# Patient Record
Sex: Male | Born: 1965 | Race: Black or African American | Hispanic: No | Marital: Married | Smoking: Never smoker
Health system: Southern US, Community
[De-identification: ages and names within clinical notes are randomized; demographics above are authoritative.]

## PROBLEM LIST (undated history)

## (undated) DIAGNOSIS — E291 Testicular hypofunction: Secondary | ICD-10-CM

## (undated) DIAGNOSIS — I10 Essential (primary) hypertension: Secondary | ICD-10-CM

## (undated) HISTORY — DX: Testicular hypofunction: E29.1

## (undated) HISTORY — DX: Essential (primary) hypertension: I10

---

## 2004-10-19 ENCOUNTER — Other Ambulatory Visit: Payer: Self-pay

## 2004-10-19 ENCOUNTER — Emergency Department: Payer: Self-pay | Admitting: Emergency Medicine

## 2006-03-08 IMAGING — NM NM MYOCARD GATED
1 series · 2 of 2 positions shown · non-contrast
Comparison: none

REASON FOR EXAM: Chest pain
COMMENTS:

[Series 1: save_screens · 2 of 2 slices shown]
[im 1/2]
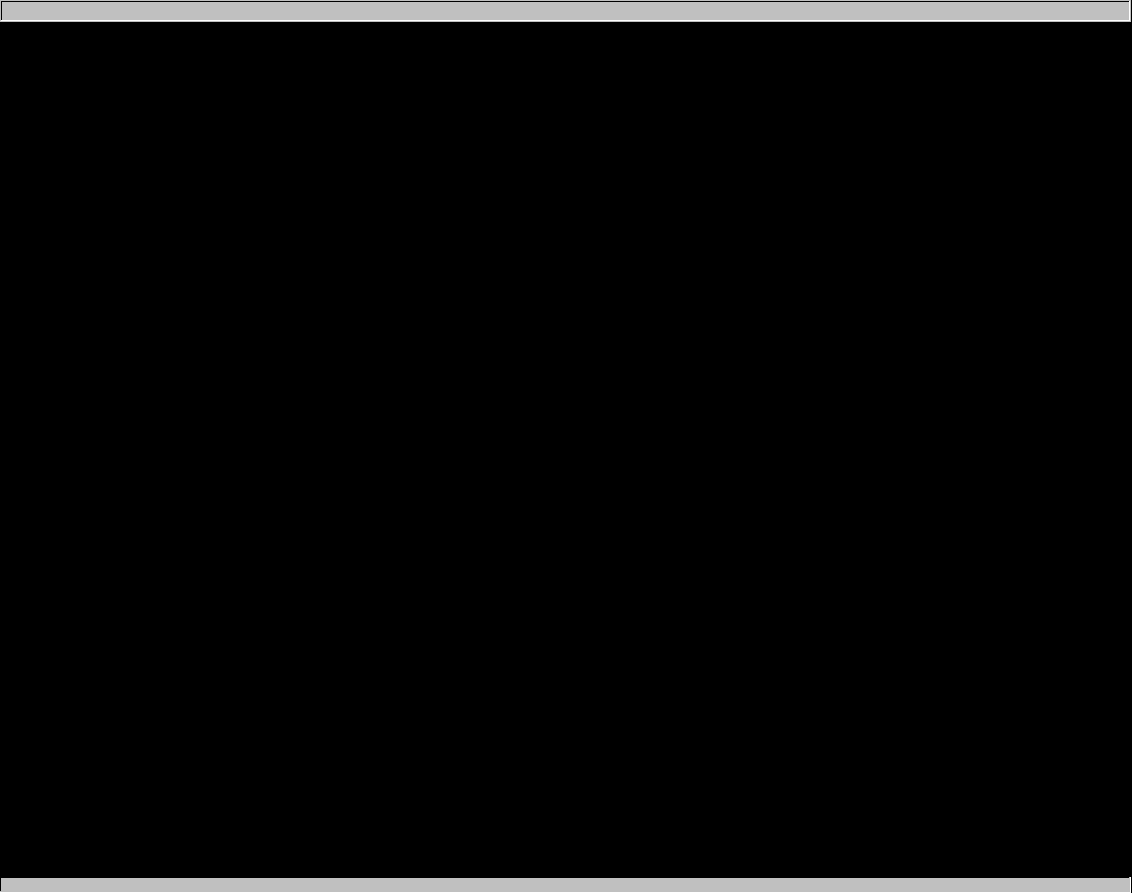
[im 2/2]
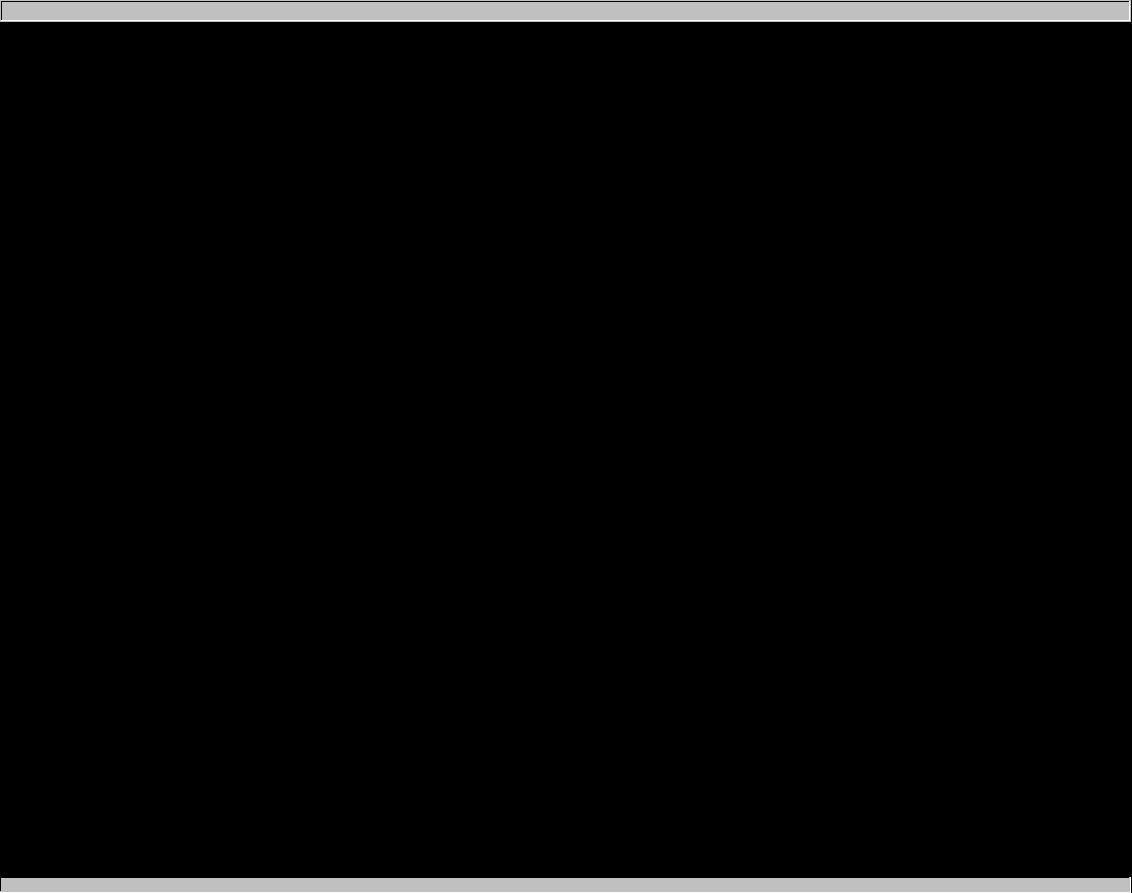

[2 of 2 positions shown; findings below may reference images not displayed]

PROCEDURE:     NM  - NM  GATED MYOVIEW   [DATE]  [DATE]

RESULT:     Stress portion was completed under the direction and read by
Hernan Foust.

The patient received 13.27 mCi of Myoview at stress and 30.01 mCi of Myoview
for delayed imaging. LEFT ventricular function is determined with ejection
fraction of 54%. Rest and exercise SPECT imaging in vertical long axis,
horizontal long axis and short axis views in comparison to computer
generated tomographic imaging revealed mild inferior hypoperfusion at stress
and rest with no significant reversibility.
IMPRESSION: 1.     LV function near normal at 54%.
2.     Fixed inferior defect with no reversibility noted on this study.

## 2011-10-12 DIAGNOSIS — N529 Male erectile dysfunction, unspecified: Secondary | ICD-10-CM

## 2011-10-12 DIAGNOSIS — E291 Testicular hypofunction: Secondary | ICD-10-CM | POA: Insufficient documentation

## 2011-10-12 DIAGNOSIS — N400 Enlarged prostate without lower urinary tract symptoms: Secondary | ICD-10-CM | POA: Insufficient documentation

## 2011-10-12 HISTORY — DX: Male erectile dysfunction, unspecified: N52.9

## 2013-12-24 ENCOUNTER — Encounter: Payer: Self-pay | Admitting: Podiatry

## 2013-12-24 ENCOUNTER — Ambulatory Visit (INDEPENDENT_AMBULATORY_CARE_PROVIDER_SITE_OTHER): Payer: BC Managed Care – PPO

## 2013-12-24 ENCOUNTER — Ambulatory Visit (INDEPENDENT_AMBULATORY_CARE_PROVIDER_SITE_OTHER): Payer: BC Managed Care – PPO | Admitting: Podiatry

## 2013-12-24 VITALS — BP 119/71 | HR 62 | Resp 16 | Ht 75.0 in | Wt 250.0 lb

## 2013-12-24 DIAGNOSIS — M898X7 Other specified disorders of bone, ankle and foot: Secondary | ICD-10-CM

## 2013-12-24 DIAGNOSIS — M779 Enthesopathy, unspecified: Secondary | ICD-10-CM

## 2013-12-24 DIAGNOSIS — M2570 Osteophyte, unspecified joint: Secondary | ICD-10-CM

## 2013-12-24 DIAGNOSIS — M7661 Achilles tendinitis, right leg: Secondary | ICD-10-CM

## 2013-12-24 DIAGNOSIS — M722 Plantar fascial fibromatosis: Secondary | ICD-10-CM

## 2013-12-24 MED ORDER — MELOXICAM 7.5 MG PO TABS: 7.5000 mg | ORAL_TABLET | Freq: Every day | ORAL | Status: DC

## 2013-12-24 NOTE — Patient Instructions (Signed)
Achilles Tendinitis   with Rehab  Achilles tendinitis is a disorder of the Achilles tendon. The Achilles tendon connects the large calf muscles (Gastrocnemius and Soleus) to the heel bone (calcaneus). This tendon is sometimes called the heel cord. It is important for pushing-off and standing on your toes and is important for walking, running, or jumping. Tendinitis is often caused by overuse and repetitive microtrauma.  SYMPTOMS  · Pain, tenderness, swelling, warmth, and redness may occur over the Achilles tendon even at rest.  · Pain with pushing off, or flexing or extending the ankle.  · Pain that is worsened after or during activity.  CAUSES   · Overuse sometimes seen with rapid increase in exercise programs or in sports requiring running and jumping.  · Poor physical conditioning (strength and flexibility or endurance).  · Running sports, especially training running down hills.  · Inadequate warm-up before practice or play or failure to stretch before participation.  · Injury to the tendon.  PREVENTION   · Warm up and stretch before practice or competition.  · Allow time for adequate rest and recovery between practices and competition.  · Keep up conditioning.  ¨ Keep up ankle and leg flexibility.  ¨ Improve or keep muscle strength and endurance.  ¨ Improve cardiovascular fitness.  · Use proper technique.  · Use proper equipment (shoes, skates).  · To help prevent recurrence, taping, protective strapping, or an adhesive bandage may be recommended for several weeks after healing is complete.  PROGNOSIS   · Recovery may take weeks to several months to heal.  · Longer recovery is expected if symptoms have been prolonged.  · Recovery is usually quicker if the inflammation is due to a direct blow as compared with overuse or sudden strain.  RELATED COMPLICATIONS   · Healing time will be prolonged if the condition is not correctly treated. The injury must be given plenty of time to heal.  · Symptoms can reoccur if  activity is resumed too soon.  · Untreated, tendinitis may increase the risk of tendon rupture requiring additional time for recovery and possibly surgery.  TREATMENT   · The first treatment consists of rest anti-inflammatory medication, and ice to relieve the pain.  · Stretching and strengthening exercises after resolution of pain will likely help reduce the risk of recurrence. Referral to a physical therapist or athletic trainer for further evaluation and treatment may be helpful.  · A walking boot or cast may be recommended to rest the Achilles tendon. This can help break the cycle of inflammation and microtrauma.  · Arch supports (orthotics) may be prescribed or recommended by your caregiver as an adjunct to therapy and rest.  · Surgery to remove the inflamed tendon lining or degenerated tendon tissue is rarely necessary and has shown less than predictable results.  MEDICATION   · Nonsteroidal anti-inflammatory medications, such as aspirin and ibuprofen, may be used for pain and inflammation relief. Do not take within 7 days before surgery. Take these as directed by your caregiver. Contact your caregiver immediately if any bleeding, stomach upset, or signs of allergic reaction occur. Other minor pain relievers, such as acetaminophen, may also be used.  · Pain relievers may be prescribed as necessary by your caregiver. Do not take prescription pain medication for longer than 4 to 7 days. Use only as directed and only as much as you need.  · Cortisone injections are rarely indicated. Cortisone injections may weaken tendons and predispose to rupture. It is better   to give the condition more time to heal than to use them.  HEAT AND COLD  · Cold is used to relieve pain and reduce inflammation for acute and chronic Achilles tendinitis. Cold should be applied for 10 to 15 minutes every 2 to 3 hours for inflammation and pain and immediately after any activity that aggravates your symptoms. Use ice packs or an ice  massage.  · Heat may be used before performing stretching and strengthening activities prescribed by your caregiver. Use a heat pack or a warm soak.  SEEK MEDICAL CARE IF:  · Symptoms get worse or do not improve in 2 weeks despite treatment.  · New, unexplained symptoms develop. Drugs used in treatment may produce side effects.  EXERCISES  RANGE OF MOTION (ROM) AND STRETCHING EXERCISES - Achilles Tendinitis   These exercises may help you when beginning to rehabilitate your injury. Your symptoms may resolve with or without further involvement from your physician, physical therapist or athletic trainer. While completing these exercises, remember:   · Restoring tissue flexibility helps normal motion to return to the joints. This allows healthier, less painful movement and activity.  · An effective stretch should be held for at least 30 seconds.  · A stretch should never be painful. You should only feel a gentle lengthening or release in the stretched tissue.  STRETCH - Gastroc, Standing   · Place hands on wall.  · Extend right / left leg, keeping the front knee somewhat bent.  · Slightly point your toes inward on your back foot.  · Keeping your right / left heel on the floor and your knee straight, shift your weight toward the wall, not allowing your back to arch.  · You should feel a gentle stretch in the right / left calf. Hold this position for __________ seconds.  Repeat __________ times. Complete this stretch __________ times per day.  STRETCH - Soleus, Standing   · Place hands on wall.  · Extend right / left leg, keeping the other knee somewhat bent.  · Slightly point your toes inward on your back foot.  · Keep your right / left heel on the floor, bend your back knee, and slightly shift your weight over the back leg so that you feel a gentle stretch deep in your back calf.  · Hold this position for __________ seconds.  Repeat __________ times. Complete this stretch __________ times per day.  STRETCH -  Gastrocsoleus, Standing   Note: This exercise can place a lot of stress on your foot and ankle. Please complete this exercise only if specifically instructed by your caregiver.   · Place the ball of your right / left foot on a step, keeping your other foot firmly on the same step.  · Hold on to the wall or a rail for balance.  · Slowly lift your other foot, allowing your body weight to press your heel down over the edge of the step.  · You should feel a stretch in your right / left calf.  · Hold this position for __________ seconds.  · Repeat this exercise with a slight bend in your knee.  Repeat __________ times. Complete this stretch __________ times per day.   STRENGTHENING EXERCISES - Achilles Tendinitis  These exercises may help you when beginning to rehabilitate your injury. They may resolve your symptoms with or without further involvement from your physician, physical therapist or athletic trainer. While completing these exercises, remember:   · Muscles can gain both the endurance   and the strength needed for everyday activities through controlled exercises.  · Complete these exercises as instructed by your physician, physical therapist or athletic trainer. Progress the resistance and repetitions only as guided.  · You may experience muscle soreness or fatigue, but the pain or discomfort you are trying to eliminate should never worsen during these exercises. If this pain does worsen, stop and make certain you are following the directions exactly. If the pain is still present after adjustments, discontinue the exercise until you can discuss the trouble with your clinician.  STRENGTH - Plantar-flexors   · Sit with your right / left leg extended. Holding onto both ends of a rubber exercise band/tubing, loop it around the ball of your foot. Keep a slight tension in the band.  · Slowly push your toes away from you, pointing them downward.  · Hold this position for __________ seconds. Return slowly, controlling the  tension in the band/tubing.  Repeat __________ times. Complete this exercise __________ times per day.   STRENGTH - Plantar-flexors   · Stand with your feet shoulder width apart. Steady yourself with a wall or table using as little support as needed.  · Keeping your weight evenly spread over the width of your feet, rise up on your toes.*  · Hold this position for __________ seconds.  Repeat __________ times. Complete this exercise __________ times per day.   *If this is too easy, shift your weight toward your right / left leg until you feel challenged. Ultimately, you may be asked to do this exercise with your right / left foot only.  STRENGTH - Plantar-flexors, Eccentric   Note: This exercise can place a lot of stress on your foot and ankle. Please complete this exercise only if specifically instructed by your caregiver.   · Place the balls of your feet on a step. With your hands, use only enough support from a wall or rail to keep your balance.  · Keep your knees straight and rise up on your toes.  · Slowly shift your weight entirely to your right / left toes and pick up your opposite foot. Gently and with controlled movement, lower your weight through your right / left foot so that your heel drops below the level of the step. You will feel a slight stretch in the back of your calf at the end position.  · Use the healthy leg to help rise up onto the balls of both feet, then lower weight only on the right / left leg again. Build up to 15 repetitions. Then progress to 3 consecutive sets of 15 repetitions.*  · After completing the above exercise, complete the same exercise with a slight knee bend (about 30 degrees). Again, build up to 15 repetitions. Then progress to 3 consecutive sets of 15 repetitions.*  Perform this exercise __________ times per day.   *When you easily complete 3 sets of 15, your physician, physical therapist or athletic trainer may advise you to add resistance by wearing a backpack filled with  additional weight.  STRENGTH - Plantar Flexors, Seated   · Sit on a chair that allows your feet to rest flat on the ground. If necessary, sit at the edge of the chair.  · Keeping your toes firmly on the ground, lift your right / left heel as far as you can without increasing any discomfort in your ankle.  Repeat __________ times. Complete this exercise __________ times a day.  *If instructed by your physician, physical therapist or athletic   trainer, you may add ____________________ of resistance by placing a weighted object on your right / left knee.  Document Released: 08/04/2004 Document Revised: 03/28/2011 Document Reviewed: 04/17/2008  [employer]® Patient Information ©2015 [employer]. This information is not intended to replace advice given to you by your health care provider. Make sure you discuss any questions you have with your health care provider.

## 2013-12-24 NOTE — Progress Notes (Signed)
   Subjective:    Patient ID: Glenn LippsJames E Boakye, male    DOB: 01/31/1965, 48 y.o.   MRN: [id number]  HPI Comments: 48 year old male presents the office today with complaints of pain in the back of his right heel. He states that he has had a bone spur for approximately 3 years and he has intermittent pain to the area. He states that at times he can barely walk due to pain. He states he has pain in the morning or after periods of rest. Over the last couple weeks she has noticed increased pain to the foot. Denies any sick injury or trauma to the area. No other complaints at this time.   Foot Pain      Review of Systems  HENT: Positive for sinus pressure.   Genitourinary: Positive for urgency.  Musculoskeletal:       Joint pain  Allergic/Immunologic: Positive for environmental allergies.  All other systems reviewed and are negative.      Objective:   Physical Exam AAO x3, NAD DP/PT pulses palpable bilaterally, CRT less than 3 seconds Protective sensation intact with Simms Weinstein monofilament, vibratory sensation intact, Achilles tendon reflex intact Tennis palpation overlying the posterior aspect of the right calcaneus at the insertion of the Achilles tendon. There is a prominent retrocalcaneal exostosis identified. There is slight overlying edema to the area. There is no pain on the mid seconds Achilles tendon and a Thompson test was performed and was negative. There is no overlying erythema or increase in warmth. There is no pain on the plantar aspect of the calcaneus on the insertion of the plantar fascia. There is no pain with lateral compression of the calcaneus or pain with vibratory sensation. There is a small retrocalcaneal exostosis palpable in the left lower extremity however not as symptomatic as the right. No pain along the course of the left Achilles tendon. MMT 5/5, ROM WNL No open lesions or pre-ulcerative lesions No pain with calf compression, swelling, warmth, erythema.          Assessment & Plan:  48 year old male with retrocalcaneal exostosis and insertional Achilles tendinitis. -X-rays were obtained and reviewed with the patient. -Conservative versus surgical treatment were discussed including alternatives, risks, complications. -At this time the patient's considering surgical intervention however like to wait until after Christmas. Discussed. Conservative treatments with the patient. At this time prescribed Mobic (patient's blood pressure is controlled). Side effects the medication were discussed the patient. Should any occur and call the office.  -Dispensed heel lifts  -Discussed stretching exercises. Discussed with him to slowly start his exercises. Should any increase in symptoms arise to decreased activity.  -Ice to the area.  -Follow-up in 4 weeks or sooner if any problems are to arise. In the meantime, call the office in the questions, concerns, change in symptoms. Follow-up with PCP for other issues mentioned and review of systems.

## 2013-12-25 ENCOUNTER — Encounter: Payer: Self-pay | Admitting: Podiatry

## 2014-01-21 ENCOUNTER — Ambulatory Visit: Payer: BC Managed Care – PPO | Admitting: Podiatry

## 2014-01-30 ENCOUNTER — Ambulatory Visit: Payer: Self-pay | Admitting: Podiatry

## 2014-02-08 ENCOUNTER — Ambulatory Visit: Payer: Self-pay | Admitting: Podiatry

## 2014-02-19 ENCOUNTER — Ambulatory Visit: Payer: Self-pay | Admitting: Podiatry

## 2014-02-20 ENCOUNTER — Ambulatory Visit: Payer: Self-pay | Admitting: Podiatry

## 2014-04-10 ENCOUNTER — Ambulatory Visit (INDEPENDENT_AMBULATORY_CARE_PROVIDER_SITE_OTHER): Payer: BLUE CROSS/BLUE SHIELD | Admitting: Podiatry

## 2014-04-10 VITALS — BP 123/84 | HR 66 | Resp 16

## 2014-04-10 DIAGNOSIS — M2570 Osteophyte, unspecified joint: Secondary | ICD-10-CM | POA: Diagnosis not present

## 2014-04-10 DIAGNOSIS — M7661 Achilles tendinitis, right leg: Secondary | ICD-10-CM

## 2014-04-10 DIAGNOSIS — M898X7 Other specified disorders of bone, ankle and foot: Secondary | ICD-10-CM

## 2014-04-10 NOTE — Patient Instructions (Signed)
Pre-Operative Instructions  Congratulations, you have decided to take an important step to improving your quality of life.  You can be assured that the doctors of [facility] will be with you every step of the way.  1. Plan to be at the surgery center/hospital at least 1 (one) hour prior to your scheduled time unless otherwise directed by the surgical center/hospital staff.  You must have a responsible adult accompany you, remain during the surgery and drive you home.  Make sure you have directions to the surgical center/hospital and know how to get there on time. 2. For hospital based surgery you will need to obtain a history and physical form from your family physician within 1 month prior to the date of surgery- we will give you a form for you primary physician.  3. We make every effort to accommodate the date you request for surgery.  There are however, times where surgery dates or times have to be moved.  We will contact you as soon as possible if a change in schedule is required.   4. No Aspirin/Ibuprofen for one week before surgery.  If you are on aspirin, any non-steroidal anti-inflammatory medications (Mobic, Aleve, Ibuprofen) you should stop taking it 7 days prior to your surgery.  You make take Tylenol  For pain prior to surgery.  5. Medications- If you are taking daily heart and blood pressure medications, seizure, reflux, allergy, asthma, anxiety, pain or diabetes medications, make sure the surgery center/hospital is aware before the day of surgery so they may notify you which medications to take or avoid the day of surgery. 6. No food or drink after midnight the night before surgery unless directed otherwise by surgical center/hospital staff. 7. No alcoholic beverages 24 hours prior to surgery.  No smoking 24 hours prior to or 24 hours after surgery. 8. Wear loose pants or shorts- loose enough to fit over bandages, boots, and casts. 9. No slip on shoes, sneakers are best. 10. Bring  your boot with you to the surgery center/hospital.  Also bring crutches or a walker if your physician has prescribed it for you.  If you do not have this equipment, it will be provided for you after surgery. 11. If you have not been contracted by the surgery center/hospital by the day before your surgery, call to confirm the date and time of your surgery. 12. Leave-time from work may vary depending on the type of surgery you have.  Appropriate arrangements should be made prior to surgery with your employer. 13. Prescriptions will be provided immediately following surgery by your doctor.  Have these filled as soon as possible after surgery and take the medication as directed. 14. Remove nail polish on the operative foot. 15. Wash the night before surgery.  The night before surgery wash the foot and leg well with the antibacterial soap provided and water paying special attention to beneath the toenails and in between the toes.  Rinse thoroughly with water and dry well with a towel.  Perform this wash unless told not to do so by your physician.  Enclosed: 1 Ice pack (please put in freezer the night before surgery)   1 Hibiclens skin cleaner   Pre-op Instructions  If you have any questions regarding the instructions, do not hesitate to call our office.  [facility]: [address] [place], [place] [address] [phone]  [facility]: [address] [place], [place] [address] [phone]  [facility]: [address]  [place], [place] [address] [phone]  Dr. Richard   Tuchman DPM, Dr. Norman Regal DPM Dr. Richard Sikora DPM, Dr. M. Todd Hyatt DPM, Dr. Kathryn Egerton DPM, Dr. Matthew Wagoner DPM 

## 2014-04-13 ENCOUNTER — Encounter: Payer: Self-pay | Admitting: Podiatry

## 2014-04-13 NOTE — Progress Notes (Addendum)
Patient ID: DISHON KEHOE, male   DOB: 08/24/1965, 49 y.o.   MRN: [id number]  Subjective: Mr. Morken presents the office today with continued pain in the back of his right heel. He states that since last appointment he has had increased pain overlying the back of his heel particular shoe gear and pressure. He states that he is started to walk with a limp due to the discomfort. He's been continuing stretching activities, anti-inflammatories, shoe gear modifications, heel lifts without any resolution. He states they started have pain around his ankle as he believes he is walking differently. He denies any recent injury or trauma. This time he is requesting surgical intervention. No other complaints at this time and no acute changes since last appointment.   Objective: AAO x3, NAD DP/PT pulses palpable b/l, CRT < 3 sec Protective sensation intact with Simms Weinstein monofilament, vibratory sensation intact, Achilles tendon reflex intact. There is significant in is along the posterior aspect of the right calcaneus at the insertion of the Achilles tendon area there is a prominent retrocalcaneal exostosis palpable. There is no pain on the midsubstance of the Achilles tendon there is no defect noted. Thompson test was performed and the Achilles tendon is intact. There is no pain with lateral compression of the calcaneus or pain with vibratory sensation. There is no pain on the plantar aspect of the calcaneus or along the course of/insertion of the plantar fascia. There is mild discomfort along the peroneal tendon posterior to lateral malleolus. There is no area pinpoint bony tenderness or pain with vibratory sensation. There is mild edema overlying the insertion of the Achilles tendon onto the calcaneus without any associated erythema or increase in warmth. No other areas of edema, erythema, increase in warmth to bilateral lower extremities. There is also prominent retrocalcaneal exostosis and the left side however it  is not symptomatic. MMT 5/5, ROM WNL No open lesions or pre-ulcer lesions identified bilaterally No pain with calf compression, swelling, warmth, erythema.  Assessment: 49 year old male with insertional Achilles tendinitis, retrocalcaneal exostosis with increased pain  Plan: -Discussed the patient both conservative and surgical options. At this time the patient states he is a tentative conservative treatment and he has pain on a more continual basis on a daily basis. At this time he is requesting surgical intervention. I discussed with him removal of the retrocalcaneal exostosis with repair of Achilles tendon and use of bone anchors. I discussed with the patient the incision placement as well as the postoperative course. Risks of the surgery were discussed the patient which include, not limited to, infection, bleeding, swelling, pain, need for further surgery, delayed or nonhealing, painful or ugly scar, numbness or sensation changes, reoccurrence, over/under correction, tendon rupture, DVT/PE, less of foot/leg, hardware failure. He understands these risks and wishes to proceed with surgery. All 3 pages the surgical consent was reviewed with the patient. No promises or guarantees of the outcome of the surgery were given. All questions were answered to the best of my ability. -Surgery will be performed at the [facility] center. -Patient is requesting a steroid injection around the symptomatic area to the right posterior calcaneus. I discussed the patient that steroid injections to this area can result in a high risk of tendon ruptures. The patient understands this and states that he has had some which pain to the area that he would like an injection. After discussing risks and complications patient verbally consented. Under sterile conditions a total of 1 mL mixture of dexamethasone  phosphate and 0.5% Marcaine plain was infiltrated into the posterior lateral aspect of the calcaneus. Care  was taken not to inject directly into the Achilles tendon. A Band-Aid was applied. Postinjection care was discussed the patient. Dispensed CAM walker. Discussed he needs to wear the cam boot at all times. -Follow-up after surgery or sooner if any problems are to arise. In the meantime encouraged to call the office with any questions, concerns, change in symptoms.

## 2014-04-21 ENCOUNTER — Encounter: Payer: Self-pay | Admitting: Podiatry

## 2014-04-21 ENCOUNTER — Telehealth: Payer: Self-pay | Admitting: *Deleted

## 2014-04-21 DIAGNOSIS — M7660 Achilles tendinitis, unspecified leg: Secondary | ICD-10-CM | POA: Diagnosis not present

## 2014-04-21 DIAGNOSIS — M2570 Osteophyte, unspecified joint: Secondary | ICD-10-CM | POA: Diagnosis not present

## 2014-04-21 DIAGNOSIS — M7730 Calcaneal spur, unspecified foot: Secondary | ICD-10-CM | POA: Diagnosis not present

## 2014-04-21 NOTE — Telephone Encounter (Signed)
Patient's short term disability paperwork fax[facility] to [facility].

## 2014-04-23 ENCOUNTER — Telehealth: Payer: Self-pay | Admitting: Podiatry

## 2014-04-23 MED ORDER — HYDROCODONE-ACETAMINOPHEN 5-325 MG PO TABS: 1.0000 | ORAL_TABLET | ORAL | Status: DC | PRN

## 2014-04-23 NOTE — Telephone Encounter (Signed)
Pt called and wanted to talk to nurse. He had surgery Monday and has some issues he wanted to talk to nurse before he would schedule an appt. Please call pt back asap.

## 2014-04-23 NOTE — Telephone Encounter (Signed)
Can someone please call him? Thanks.

## 2014-04-23 NOTE — Progress Notes (Signed)
DOS 04/21/2014 Right repair of Achilles tendon and removal of bone spur back of heel, use of bone anchors, and application of below the knee cast.

## 2014-04-23 NOTE — Telephone Encounter (Signed)
Patient has called back again wanting to speak to a nurse. Dr. Ardelle AntonWagoner performed surgery on him Monday and he has some concerns. He stated he feels like he still has some feeling of the nerve block. He also thinks he may be having a reaction to medication. He called our office because he is unable to get through to someone in the [place] office. Please call patient ASAP. Thank you.

## 2014-04-23 NOTE — Telephone Encounter (Signed)
The block may take a few days to wear off. He can call the surgery center and talk to anesthesia as well about this. In regards to the itching, he can take the phenrgan to help with that or we can switch to vicodin.

## 2014-04-23 NOTE — Telephone Encounter (Signed)
Patient is stating that he still has numbness around the ankle and the shin area, also numbness in the inner thigh of his leg going up to his private area, also that he is having itching after taking medication.

## 2014-04-29 ENCOUNTER — Ambulatory Visit (INDEPENDENT_AMBULATORY_CARE_PROVIDER_SITE_OTHER): Payer: BLUE CROSS/BLUE SHIELD

## 2014-04-29 ENCOUNTER — Encounter: Payer: Self-pay | Admitting: Podiatry

## 2014-04-29 ENCOUNTER — Ambulatory Visit (INDEPENDENT_AMBULATORY_CARE_PROVIDER_SITE_OTHER): Payer: BLUE CROSS/BLUE SHIELD | Admitting: Podiatry

## 2014-04-29 ENCOUNTER — Encounter: Payer: BLUE CROSS/BLUE SHIELD | Admitting: Podiatry

## 2014-04-29 VITALS — BP 123/88 | HR 70 | Resp 16

## 2014-04-29 DIAGNOSIS — Z9889 Other specified postprocedural states: Secondary | ICD-10-CM

## 2014-04-29 NOTE — Progress Notes (Signed)
Patient ID: Glenn Turner, male   DOB: 03/20/1965, 49 y.o.   MRN: [id number]  Subjective: Glenn Turner presents the office today postop visit #1 status post right retrocalcaneal exostectomy and Achilles tendon repair. He states that the cast feels tight otherwise he is doing well. He states he has had pain intermittently. He's remain nonweightbearing as much possible. He denies any systemic complaints as fevers, chills, nausea, vomiting. Denies any calf pain, chest pain, shortness of breath. No other complaints at this time in no acute changes since last appointment.  Objective: AAO X3, NAD DP/PT pulses palpable, CRT less than 3 seconds Protective sensation intact with Simms Weinstein monofilament Cast is clean, dry, intact. Does not appear to be tight. Incision on the posterior aspect the distal Achilles tendon as well coapted without any evidence dehiscence the sutures/staples are intact. There is mild overlying edema without any assisted erythema, increased warmth, drainage, malodor, or any other clinical signs of infection. There is mild tenderness to palpation along the surgical site. After that is performed is negative. No other areas of tenderness to bilateral lower extremity. No other areas of edema, erythema, increased warmth. No open lesions or pre-ulcer lesions identified bilaterally. No pain with calf compression, swelling, warmth, erythema.  Assessment: 49 year old male one week status post right retrocalcaneal exostectomy, Achilles tendon repair.  Plan: -X-rays were obtained and reviewed the patient. -Cast is removed. -Antibiotic ointment was applied to the incision followed by dry sterile dressing. -Well padded BK cast was applied.  -Continue NWB -Ice and elevation -Pain medication as needed -Monitor for clinical signs or symptoms of infection and/or DVT/PE. Directed to call the office if any are to occur or go directly to the emergency room. -Follow-up in 10 days for likely suture  removal or sooner if any problems are to arise. In the meantime encouraged to call the office and questions, concerns, change in symptoms.

## 2014-05-06 ENCOUNTER — Encounter: Payer: BLUE CROSS/BLUE SHIELD | Admitting: Podiatry

## 2014-05-08 ENCOUNTER — Ambulatory Visit (INDEPENDENT_AMBULATORY_CARE_PROVIDER_SITE_OTHER): Payer: BLUE CROSS/BLUE SHIELD | Admitting: Podiatry

## 2014-05-08 ENCOUNTER — Encounter: Payer: BLUE CROSS/BLUE SHIELD | Admitting: Podiatry

## 2014-05-08 VITALS — BP 120/80 | HR 66 | Resp 16

## 2014-05-08 DIAGNOSIS — Z9889 Other specified postprocedural states: Secondary | ICD-10-CM

## 2014-05-08 DIAGNOSIS — M7661 Achilles tendinitis, right leg: Secondary | ICD-10-CM

## 2014-05-08 NOTE — Patient Instructions (Signed)
Wear CAM boot AT ALL TIMES Monitor for any signs/symptoms of infection. Call the office immediately if any occur or go directly to the emergency room. Call with any questions/concerns.

## 2014-05-13 ENCOUNTER — Encounter: Payer: Self-pay | Admitting: Podiatry

## 2014-05-13 NOTE — Progress Notes (Signed)
Patient ID: Glenn Turner, male   DOB: 06/05/1965, 49 y.o.   MRN: [id number]  Subjective: Glenn Turner presents the office today postop visit #2 status post right retrocalcaneal exostectomy and Achilles tendon tenolysis. He states he  Continues to have pain intermittently,  However is controlled pain medicine. He states that he's been trying to remain nonweightbearing versus possible however he does put his foot down the crown in the cast.  He denies any systemic complaints as fevers, chills, nausea, vomiting. Denies any calf pain, chest pain, shortness of breath. No other complaints at this time in no acute changes since last appointment.  Objective: AAO X3, NAD DP/PT pulses palpable, CRT less than 3 seconds Protective sensation intact with Simms Weinstein monofilament Cast is dirty on the bottom.  Incision on the posterior aspect the distal Achilles tendon as well coapted without any evidence dehiscence the sutures/staples are intact. There is mild overlying edema without any assisted erythema, increased warmth, drainage, malodor, or any other clinical signs of infection. The edema does appear to be improved. There is mild tenderness to palpation along the surgical site. Thompson test was performed and is negative. No other areas of tenderness to bilateral lower extremity. No other areas of edema, erythema, increased warmth. No open lesions or pre-ulcer lesions identified bilaterally. No pain with calf compression, swelling, warmth, erythema.  Assessment: 49 year old male one week status post right retrocalcaneal exostectomy, Achilles tendon repair.  Plan: -Treatment  Options discussed included alternatives, risks, competitions. -Cast was removed. Sutures were removed however staples remained intact. Antibiotic ointment was applied to the incision followed by dry sterile dressing. -Dispensed CAM walker. He must wear this AT ALL TIMES -Continue NWB -Ice and elevation -Pain medication as  needed -Monitor for clinical signs or symptoms of infection and/or DVT/PE. Directed to call the office if any are to occur or go directly to the emergency room. -Follow-up in 1 week for staple removal or sooner if any problems are to arise. In the meantime encouraged to call the office and questions, concerns, change in symptoms.

## 2014-05-15 ENCOUNTER — Ambulatory Visit (INDEPENDENT_AMBULATORY_CARE_PROVIDER_SITE_OTHER): Payer: BLUE CROSS/BLUE SHIELD | Admitting: Podiatry

## 2014-05-15 VITALS — BP 116/78 | HR 66 | Resp 16

## 2014-05-15 DIAGNOSIS — Z9889 Other specified postprocedural states: Secondary | ICD-10-CM

## 2014-05-15 DIAGNOSIS — M7661 Achilles tendinitis, right leg: Secondary | ICD-10-CM

## 2014-05-19 ENCOUNTER — Encounter: Payer: Self-pay | Admitting: Podiatry

## 2014-05-19 NOTE — Progress Notes (Signed)
Patient ID: Flora LippsJames E Venturino, male   DOB: 06/30/1965, 49 y.o.   MRN: [id number]   Subjective: Mr. Laural RoesBuie presents the office today postop visit #3 status post right retrocalcaneal exostectomy and Achilles tendon tenolysis. He states to have some pain  Intermittently however it is improving,   He states that he's been trying to remain nonweightbearing versus possible however he does put his foot down on the ground in the boot.  He denies any systemic complaints as fevers, chills, nausea, vomiting. Denies any calf pain, chest pain, shortness of breath. No other complaints at this time in no acute changes since last appointment.  Objective: AAO X3, NAD DP/PT pulses palpable, CRT less than 3 seconds Protective sensation intact with Simms Weinstein monofilament Incision on the posterior aspect the distal Achilles tendon as well coapted without any evidence dehiscence the staples are intact. There is mild overlying edema without any assisted erythema, increased warmth, drainage, malodor, or any other clinical signs of infection. The edema does appear to continue to improve. There is mild tenderness to palpation along the surgical site. Thompson test was performed and is negative. No other areas of tenderness to bilateral lower extremity. No other areas of edema, erythema, increased warmth. No open lesions or pre-ulcer lesions identified bilaterally. No pain with calf compression, swelling, warmth, erythema.  Assessment: 49 year old male one week status post right retrocalcaneal exostectomy, Achilles tendon repair.  Plan: -Treatment options discussed included alternatives, risks, competitions. -CStapleswere removed however staples remained intact. Antibiotic ointment was applied to the incision followed by dry sterile dressing. Discussed the patient that he can remove the dressing in 24 hours insert is shower V problem the incision. There is any problems incisions he sounld call thethe office -Coninue CAM walker.  He must wear this AT ALL TIMES -Continue NWB -Ice and elevation -Pain medication as needed -Monitor for clinical signs or symptoms of infection and/or DVT/PE. Directed to call the office if any are to occur or go directly to the emergency room. -Follow-up in 2 weeks foor sooner if any problems are to arise. In the meantime encouraged to call the office and questions, concerns, change in symptoms.

## 2014-05-27 ENCOUNTER — Encounter: Payer: Self-pay | Admitting: Podiatry

## 2014-05-27 ENCOUNTER — Ambulatory Visit (INDEPENDENT_AMBULATORY_CARE_PROVIDER_SITE_OTHER): Payer: BLUE CROSS/BLUE SHIELD | Admitting: Podiatry

## 2014-05-27 DIAGNOSIS — M7661 Achilles tendinitis, right leg: Secondary | ICD-10-CM

## 2014-05-27 DIAGNOSIS — Z9889 Other specified postprocedural states: Secondary | ICD-10-CM

## 2014-05-28 NOTE — Progress Notes (Signed)
Patient ID: Glenn Turner, male   DOB: 04/03/1965, 49 y.o.   MRN: [id number]   Subjective: Glenn Turner presents the office today 4 weeks status post right retrocalcaneal exostectomy and Achilles tendon tenolysis. States he pain as improved. He has been continuing with the ROM exercises which has been doing at home without any problems  He has started to put more weight on his foot although he was to remain NWB.  He denies any systemic complaints as fevers, chills, nausea, vomiting. Denies any calf pain, chest pain, shortness of breath. No other complaints at this time in no acute changes since last appointment.  Objective: AAO X3, NAD DP/PT pulses palpable, CRT less than 3 seconds Protective sensation intact with Simms Weinstein monofilament Incision on the posterior aspect the distal Achilles tendon as well coapted without any evidence dehiscence. He states he picked the scab off the incision and there is a very small superficial granular area along the most inferior aspect of the incision.  There is decreasededema without any associated erythema, increased warmth, drainage, malodor, or any other clinical signs of infection. There is no tenderness to palpation along the surgical site. Thompson test was performed and is negative. No other areas of tenderness to bilateral lower extremity. No other areas of edema, erythema, increased warmth. No open lesions or pre-ulcer lesions identified bilaterally. No pain with calf compression, swelling, warmth, erythema.  Assessment: 49 year old male 4 weeks status post right retrocalcaneal exostectomy, Achilles tendon repair.  Plan: -Treatment options discussed included alternatives, risks, competitions. -Antibiotic ointment and a bandage was applied to the inferior aspect of the incision. Continue this daily. Monitor for any worsening the incision. There is any problems call the office and medially. -Coninue CAM walker.He can start to transition to weightbearing as  tolerated in the [employer]. -Ice and elevation -Pain medication as needed -Monitor for clinical signs or symptoms of infection and/or DVT/PE. Directed to call the office if any are to occur or go directly to the emergency room. -Follow-up in 3 weeks foor sooner if any problems are to arise. In the meantime encouraged to call the office and questions, concerns, change in symptoms. At that time we'll likely transition back into his shoe.

## 2014-06-17 ENCOUNTER — Encounter: Payer: Self-pay | Admitting: Podiatry

## 2014-06-17 ENCOUNTER — Ambulatory Visit (INDEPENDENT_AMBULATORY_CARE_PROVIDER_SITE_OTHER): Payer: BLUE CROSS/BLUE SHIELD | Admitting: Podiatry

## 2014-06-17 VITALS — BP 120/80 | HR 66 | Resp 16

## 2014-06-17 DIAGNOSIS — M7661 Achilles tendinitis, right leg: Secondary | ICD-10-CM

## 2014-06-17 DIAGNOSIS — Z9889 Other specified postprocedural states: Secondary | ICD-10-CM

## 2014-06-17 MED ORDER — HYDROCODONE-ACETAMINOPHEN 5-325 MG PO TABS: 1.0000 | ORAL_TABLET | Freq: Four times a day (QID) | ORAL | Status: DC | PRN

## 2014-06-17 NOTE — Progress Notes (Signed)
Patient ID: Flora LippsJames E Housholder, male   DOB: 03/13/1965, 49 y.o.   MRN: [id number]   Subjective: Mr. Laural RoesBuie presents the office today 7 weeks status post right retrocalcaneal exostectomy and Achilles tendon tenolysis. States he pain as improved but states that he does continue to have some intermittent soreness. He has continue with range of motion activities without any difficulty. He has an angular the [employer] without any difficulties. He denies any systemic complaints as fevers, chills, nausea, vomiting. Denies any calf pain, chest pain, shortness of breath. No other complaints at this time in no acute changes since last appointment.  Objective: AAO X3, NAD DP/PT pulses palpable, CRT less than 3 seconds Protective sensation intact with Simms Weinstein monofilament Incision on the posterior aspect the distal Achilles tendon as well coapted without any evidence dehiscence. There is no surrounding erythema, ascending cellulitis, fluctuance, crepitus, malodor, drainage, or any other clinical signs of infection at this time. There is mild tenderness overlying the distal Achilles tendon and along the medial aspect of the ankle in which there is mild localized edema. There is no cystic erythema or increase in warmth. Range of motion is intact and there is full strength in both dorsiflexion and plantar flexion of ankle without any pain. Janee Mornhompson test is performed is negative and there is no defect noted in the Achilles tendon. No other areas of tenderness to bilateral lower extremity's. No other areas of edema, erythema, increase in warmth. No open lesions or pre-ulcerative lesions are identified bilaterally. There is no pain with calf compression, swelling, warmth, erythema  Assessment: 49 year old male 7 weeks status post right retrocalcaneal exostectomy, Achilles tendon repair.  Plan: -Treatment options discussed included alternatives, risks, competitions. -At this time he can start to transition to a regular  shoe as tolerated. If he has any increase in pain during transition to return to the CAM Walker and call the office. -Patient to hold off on them at the work until he is able to ambulate in a regular shoe without difficulty. -Dispensed compression anklet -Dispensed ankle brace to wear if needed with his shoes.  -Ice and elevation -Pain medication as needed; refilled Vicodin  -Monitor for clinical signs or symptoms of infection and/or DVT/PE. Directed to call the office if any are to occur or go directly to the emergency room. -Follow-up in 2 weeks foor sooner if any problems are to arise. In the meantime encouraged to call the office and questions, concerns, change in symptoms.

## 2014-07-01 ENCOUNTER — Encounter: Payer: Self-pay | Admitting: Podiatry

## 2014-07-01 ENCOUNTER — Ambulatory Visit (INDEPENDENT_AMBULATORY_CARE_PROVIDER_SITE_OTHER): Payer: BLUE CROSS/BLUE SHIELD | Admitting: Podiatry

## 2014-07-01 VITALS — BP 121/79 | HR 73 | Resp 16

## 2014-07-01 DIAGNOSIS — M7661 Achilles tendinitis, right leg: Secondary | ICD-10-CM

## 2014-07-01 DIAGNOSIS — Z9889 Other specified postprocedural states: Secondary | ICD-10-CM

## 2014-07-02 ENCOUNTER — Encounter: Payer: Self-pay | Admitting: Podiatry

## 2014-07-02 NOTE — Progress Notes (Signed)
Patient ID: SPIROS RUTIGLIANO, male   DOB: 1965/08/30, 49 y.o.   MRN: [id number]  Subjective: Mr. Heinle presents the office today 9 weeks status post right retrocalcaneal exostectomy and Achilles tendon tenolysis. He states that he continues with regular shoe without any difficulties been performing the stretching activities. He states his range of motion spot seen on the contralateral extremity. At this time he is asking to go back to work as he feels ready to do so. He has some intermittent discomfort at times however overall is doing well. He is not requiring any pain medication. He denies any systemic complaints as fevers, chills, nausea, vomiting. Denies any calf pain, chest pain, shortness of breath. No other complaints at this time in no acute changes since last appointment.  Objective: AAO X3, NAD DP/PT pulses palpable, CRT less than 3 seconds Protective sensation intact with Simms Weinstein monofilament Incision on the posterior aspect the distal Achilles tendon as well coapted without any evidence dehiscence. There is no surrounding erythema, ascending cellulitis, fluctuance, crepitus, malodor, drainage, or any other clinical signs of infection at this time. There is no tenderness overlying the distal Achilles tendon over the surgical site. The is mild tenderness over medial/lateral aspect of the ankle in which there is mild localized edema, although this appears to be resolving and subjectively the patient states his pain has improved. There is no surrounding erythema or increase in warmth. Range of motion is intact and there is full strength in both dorsiflexion and plantar flexion of ankle without any pain. Janee Morn test is performed is negative and there is no defect noted in the Achilles tendon. No other areas of tenderness to bilateral lower extremities.  No other areas of edema, erythema, increase in warmth. No open lesions or pre-ulcerative lesions are identified bilaterally.  There is no pain  with calf compression, swelling, warmth, erythema  Assessment: 49 year old male 9 weeks status post right retrocalcaneal exostectomy, Achilles tendon repair.  Plan: -Treatment options discussed included alternatives, risks, competitions. -At this time there appears to be significant improvement in his symptoms, last appointment. I believe this point he is ready to back to work. He underwent to work on 07/07/2014 without restrictions. -Dispensed compression anklet -Ankle brace to wear if needed   -Ice and elevation -Monitor for clinical signs or symptoms of infection and/or DVT/PE. Directed to call the office if any are to occur or go directly to the emergency room. -Follow-up in 4 weeks foor sooner if any problems are to arise. In the meantime encouraged to call the office and questions, concerns, change in symptoms.

## 2014-07-29 ENCOUNTER — Ambulatory Visit (INDEPENDENT_AMBULATORY_CARE_PROVIDER_SITE_OTHER): Payer: BLUE CROSS/BLUE SHIELD | Admitting: Podiatry

## 2014-07-29 ENCOUNTER — Encounter: Payer: Self-pay | Admitting: Podiatry

## 2014-07-29 VITALS — BP 122/78 | HR 77 | Resp 18

## 2014-07-29 DIAGNOSIS — Z9889 Other specified postprocedural states: Secondary | ICD-10-CM

## 2014-07-29 DIAGNOSIS — M7661 Achilles tendinitis, right leg: Secondary | ICD-10-CM

## 2014-07-29 MED ORDER — HYDROCODONE-ACETAMINOPHEN 5-325 MG PO TABS: 1.0000 | ORAL_TABLET | Freq: Four times a day (QID) | ORAL | Status: DC | PRN

## 2014-07-31 ENCOUNTER — Encounter: Payer: Self-pay | Admitting: Podiatry

## 2014-07-31 NOTE — Progress Notes (Signed)
Patient ID: Glenn Turner, male   DOB: 02/27/1965, 49 y.o.   MRN: [id number]  Subjective: Glenn Turner presents the office today status post right retrocalcaneal exostectomy and Achilles tendon tenolysis. He states that he continues with regular shoe and he has returned to work. He has been continuing with range of motion exercises on a daily basis. He does state that he has some intermittent discomfort to the area particularly after being on his feet all day. He states that overall his pain is significantly improved compared to what his last few prior to surgery lesions of the pain is just from healing from the surgery. He denies any systemic complaints as fevers, chills, nausea, vomiting. Denies any calf pain, chest pain, shortness of breath. No other complaints at this time in no acute changes since last appointment.  Objective: AAO X3, NAD DP/PT pulses palpable, CRT less than 3 seconds Protective sensation intact with Simms Weinstein monofilament Incision on the posterior aspect the distal Achilles tendon as well coapted without any evidence dehiscence. There is no surrounding erythema, ascending cellulitis, fluctuance, crepitus, malodor, drainage, or any other clinical signs of infection at this time. There is mild discomfort overlying the distal Achilles tendon over the surgical site. The is no tenderness over medial/lateral aspect of the ankle where he had pain prior. There is decreased edema to the area. There is no surrounding erythema or increase in warmth. Range of motion is intact and there is full strength in both dorsiflexion and plantar flexion of ankle without any pain. Janee Mornhompson test is performed is negative and there is no defect noted in the Achilles tendon. No other areas of tenderness to bilateral lower extremities.  No other areas of edema, erythema, increase in warmth. No open lesions or pre-ulcerative lesions are identified bilaterally.  There is no pain with calf compression, swelling,  warmth, erythema  Assessment: 49 year old male status post right retrocalcaneal exostectomy, Achilles tendon repair.  Plan: -Treatment options discussed included alternatives, risks, competitions. -Continue with her shoe gear as tolerated. -Continue daily stretching and rehabilitation exercises. Discussed physical therapy although he states he feels that he is making improvements and would like to hold off. -Dispensed another compression anklet -Ankle brace if needed   -Ice and elevation -Monitor for clinical signs or symptoms of infection and/or DVT/PE. Directed to call the office if any are to occur or go directly to the emergency room. -Follow-up in 8 weeks foor sooner if any problems are to arise. In the meantime encouraged to call the office and questions, concerns, change in symptoms.  Ovid CurdMatthew Vane Yapp, DPM

## 2014-09-23 ENCOUNTER — Encounter: Payer: BLUE CROSS/BLUE SHIELD | Admitting: Podiatry

## 2014-10-02 ENCOUNTER — Encounter: Payer: Self-pay | Admitting: Podiatry

## 2014-10-02 ENCOUNTER — Ambulatory Visit (INDEPENDENT_AMBULATORY_CARE_PROVIDER_SITE_OTHER): Payer: BLUE CROSS/BLUE SHIELD | Admitting: Podiatry

## 2014-10-02 VITALS — BP 146/97 | HR 90 | Resp 18

## 2014-10-02 DIAGNOSIS — M898X7 Other specified disorders of bone, ankle and foot: Secondary | ICD-10-CM

## 2014-10-02 DIAGNOSIS — M2570 Osteophyte, unspecified joint: Secondary | ICD-10-CM | POA: Diagnosis not present

## 2014-10-02 DIAGNOSIS — Z9889 Other specified postprocedural states: Secondary | ICD-10-CM | POA: Diagnosis not present

## 2014-10-02 DIAGNOSIS — M7661 Achilles tendinitis, right leg: Secondary | ICD-10-CM

## 2014-10-02 NOTE — Progress Notes (Signed)
Patient ID: Glenn Turner, male   DOB: 05-02-1965, 49 y.o.   MRN: [id number]  Subjective: Mr. Glenn Turner presents the office today status post right retrocalcaneal exostectomy and Achilles tendon tenolysis. He states that overall he is doing well his pain is significantly improved compared to what it was prior to surgery. He gets an occasional discomfort at times after he stands on his feet at work all day. Other than that his pain is greatly improved. Denies a swelling or redness. He has gradually been increasing his activity. He denies any systemic complaints as fevers, chills, nausea, vomiting. Denies any calf pain, chest pain, shortness of breath. No other complaints at this time in no acute changes since last appointment.  Objective: AAO X3, NAD DP/PT pulses palpable, CRT less than 3 seconds Protective sensation intact with Simms Weinstein monofilament Incision on the posterior aspect the distal Achilles tendon as well coapted without any evidence dehiscence and a scar has formed. There is no surrounding erythema, ascending cellulitis, fluctuance, crepitus, malodor, drainage, or any other clinical signs of infection at this time. There is no discomfort overlying the distal Achilles tendon over the surgical site or along the posterior calcaneous. There is no overlying edema, erythema, increase in warmth. Ankle range of motion is intact. There is no defect noted within the Achilles tendon and Thompson test is negative. No other areas tenderness to bilateral lower extremities. No other areas of edema, erythema, increase in warmth. No open lesions or pre-ulcerative lesions are identified bilaterally.  There is no pain with calf compression, swelling, warmth, erythema  Assessment: 49 year old male status post right retrocalcaneal exostectomy, Achilles tendon repair; doing well  Plan: -Treatment options discussed included alternatives, risks, competitions. -Continue with supportive shoe gear as  tolerated. -Continue daily stretching and rehabilitation exercises.  -Ice and elevation prn -Continue to increase activity as tolerated. The patient is an increase in pain to call the office immediately. -Follow-up as needed. In the meantime encouraged to call the office and questions, concerns, change in symptoms. At this time he is discharged from his postoperative course. If is any problems in the future call the office be more than happy to see him.  Ovid Curd, DPM

## 2015-01-16 ENCOUNTER — Other Ambulatory Visit: Payer: Self-pay | Admitting: Physician Assistant

## 2015-01-16 DIAGNOSIS — M898X9 Other specified disorders of bone, unspecified site: Secondary | ICD-10-CM

## 2015-02-09 ENCOUNTER — Ambulatory Visit
Admission: RE | Admit: 2015-02-09 | Discharge: 2015-02-09 | Disposition: A | Discharge status: Home / Self Care | Payer: BLUE CROSS/BLUE SHIELD | Source: Ambulatory Visit | Attending: Physician Assistant | Admitting: Physician Assistant

## 2015-02-09 DIAGNOSIS — M25461 Effusion, right knee: Secondary | ICD-10-CM | POA: Diagnosis not present

## 2015-02-09 DIAGNOSIS — M898X9 Other specified disorders of bone, unspecified site: Secondary | ICD-10-CM

## 2015-02-09 DIAGNOSIS — M25561 Pain in right knee: Secondary | ICD-10-CM | POA: Insufficient documentation

## 2015-02-09 DIAGNOSIS — M899 Disorder of bone, unspecified: Secondary | ICD-10-CM | POA: Diagnosis present

## 2015-02-09 DIAGNOSIS — M93861 Other specified osteochondropathies, right lower leg: Secondary | ICD-10-CM | POA: Insufficient documentation

## 2015-02-09 MED ORDER — GADOBENATE DIMEGLUMINE 529 MG/ML IV SOLN
20.0000 mL | Freq: Once | INTRAVENOUS | Status: AC | PRN
Start: 2015-02-09 — End: 2015-02-09
  Administered 2015-02-09: 20 mL via INTRAVENOUS

## 2015-10-08 DIAGNOSIS — I10 Essential (primary) hypertension: Secondary | ICD-10-CM | POA: Insufficient documentation

## 2015-10-08 DIAGNOSIS — R7989 Other specified abnormal findings of blood chemistry: Secondary | ICD-10-CM | POA: Insufficient documentation

## 2015-12-31 DIAGNOSIS — R251 Tremor, unspecified: Secondary | ICD-10-CM | POA: Insufficient documentation

## 2017-10-26 DIAGNOSIS — M1712 Unilateral primary osteoarthritis, left knee: Secondary | ICD-10-CM | POA: Insufficient documentation

## 2017-10-26 HISTORY — DX: Unilateral primary osteoarthritis, left knee: M17.12

## 2018-02-01 DIAGNOSIS — R7303 Prediabetes: Secondary | ICD-10-CM | POA: Insufficient documentation

## 2018-02-01 HISTORY — DX: Prediabetes: R73.03

## 2019-01-02 ENCOUNTER — Other Ambulatory Visit: Payer: Self-pay

## 2019-01-02 ENCOUNTER — Ambulatory Visit (INDEPENDENT_AMBULATORY_CARE_PROVIDER_SITE_OTHER): Payer: BC Managed Care – PPO | Admitting: Urology

## 2019-01-02 ENCOUNTER — Encounter: Payer: Self-pay | Admitting: Urology

## 2019-01-02 VITALS — BP 146/82 | HR 67 | Ht 76.0 in | Wt 264.0 lb

## 2019-01-02 DIAGNOSIS — N400 Enlarged prostate without lower urinary tract symptoms: Secondary | ICD-10-CM | POA: Diagnosis not present

## 2019-01-02 DIAGNOSIS — R3915 Urgency of urination: Secondary | ICD-10-CM

## 2019-01-02 DIAGNOSIS — N5203 Combined arterial insufficiency and corporo-venous occlusive erectile dysfunction: Secondary | ICD-10-CM

## 2019-01-02 DIAGNOSIS — R3913 Splitting of urinary stream: Secondary | ICD-10-CM | POA: Diagnosis not present

## 2019-01-02 LAB — MICROSCOPIC EXAMINATION
Bacteria, UA: NONE SEEN
Epithelial Cells (non renal): NONE SEEN /hpf (ref 0–10)
RBC: NONE SEEN /hpf (ref 0–2)

## 2019-01-02 LAB — URINALYSIS, COMPLETE
Bilirubin, UA: NEGATIVE
Glucose, UA: NEGATIVE
Ketones, UA: NEGATIVE
Leukocytes,UA: NEGATIVE
Nitrite, UA: NEGATIVE
Protein,UA: NEGATIVE
RBC, UA: NEGATIVE
Specific Gravity, UA: 1.02 (ref 1.005–1.030)
Urobilinogen, Ur: 0.2 mg/dL (ref 0.2–1.0)
pH, UA: 6 (ref 5.0–7.5)

## 2019-01-02 LAB — BLADDER SCAN AMB NON-IMAGING: Scan Result: 0

## 2019-01-02 MED ORDER — OXYBUTYNIN CHLORIDE ER 10 MG PO TB24
10.0000 mg | ORAL_TABLET | Freq: Every day | ORAL | 11 refills | Status: DC
Start: 2019-01-02 — End: 2019-08-16

## 2019-01-02 NOTE — Patient Instructions (Signed)
Cystoscopy Cystoscopy is a procedure that is used to help diagnose and sometimes treat conditions that affect the lower urinary tract. The lower urinary tract includes the bladder and the urethra. The urethra is the tube that drains urine from the bladder. Cystoscopy is done using a thin, tube-shaped instrument with a light and camera at the end (cystoscope). The cystoscope may be hard or flexible, depending on the goal of the procedure. The cystoscope is inserted through the urethra, into the bladder. Cystoscopy may be recommended if you have:  Urinary tract infections that keep coming back.  Blood in the urine (hematuria).  An inability to control when you urinate (urinary incontinence) or an overactive bladder.  Unusual cells found in a urine sample.  A blockage in the urethra, such as a urinary stone.  Painful urination.  An abnormality in the bladder found during an intravenous pyelogram (IVP) or CT scan. Cystoscopy may also be done to remove a sample of tissue to be examined under a microscope (biopsy). Tell a health care provider about:  Any allergies you have.  All medicines you are taking, including vitamins, herbs, eye drops, creams, and over-the-counter medicines.  Any problems you or family members have had with anesthetic medicines.  Any blood disorders you have.  Any surgeries you have had.  Any medical conditions you have.  Whether you are pregnant or may be pregnant. What are the risks? Generally, this is a safe procedure. However, problems may occur, including:  Infection.  Bleeding.  Allergic reactions to medicines.  Damage to other structures or organs. What happens before the procedure?  Ask your health care provider about: ? Changing or stopping your regular medicines. This is especially important if you are taking diabetes medicines or blood thinners. ? Taking medicines such as aspirin and ibuprofen. These medicines can thin your blood. Do not take  these medicines unless your health care provider tells you to take them. ? Taking over-the-counter medicines, vitamins, herbs, and supplements.  Follow instructions from your health care provider about eating or drinking restrictions.  Ask your health care provider what steps will be taken to help prevent infection. These may include: ? Washing skin with a germ-killing soap. ? Taking antibiotic medicine.  You may have an exam or testing, such as: ? X-rays of the bladder, urethra, or kidneys. ? Urine tests to check for signs of infection.  Plan to have someone take you home from the hospital or clinic. What happens during the procedure?   You will be given one or more of the following: ? A medicine to help you relax (sedative). ? A medicine to numb the area (local anesthetic).  The area around the opening of your urethra will be cleaned.  The cystoscope will be passed through your urethra into your bladder.  Germ-free (sterile) fluid will flow through the cystoscope to fill your bladder. The fluid will stretch your bladder so that your health care provider can clearly examine your bladder walls.  Your doctor will look at the urethra and bladder. Your doctor may take a biopsy or remove stones.  The cystoscope will be removed, and your bladder will be emptied. The procedure may vary among health care providers and hospitals. What can I expect after the procedure? After the procedure, it is common to have:  Some soreness or pain in your abdomen and urethra.  Urinary symptoms. These include: ? Mild pain or burning when you urinate. Pain should stop within a few minutes after you urinate. This   may last for up to 1 week. ? A small amount of blood in your urine for several days. ? Feeling like you need to urinate but producing only a small amount of urine. Follow these instructions at home: Medicines  Take over-the-counter and prescription medicines only as told by your health care  provider.  If you were prescribed an antibiotic medicine, take it as told by your health care provider. Do not stop taking the antibiotic even if you start to feel better. General instructions  Return to your normal activities as told by your health care provider. Ask your health care provider what activities are safe for you.  Do not drive for 24 hours if you were given a sedative during your procedure.  Watch for any blood in your urine. If the amount of blood in your urine increases, call your health care provider.  Follow instructions from your health care provider about eating or drinking restrictions.  If a tissue sample was removed for testing (biopsy) during your procedure, it is up to you to get your test results. Ask your health care provider, or the department that is doing the test, when your results will be ready.  Drink enough fluid to keep your urine pale yellow.  Keep all follow-up visits as told by your health care provider. This is important. Contact a health care provider if you:  Have pain that gets worse or does not get better with medicine, especially pain when you urinate.  Have trouble urinating.  Have more blood in your urine. Get help right away if you:  Have blood clots in your urine.  Have abdominal pain.  Have a fever or chills.  Are unable to urinate. Summary  Cystoscopy is a procedure that is used to help diagnose and sometimes treat conditions that affect the lower urinary tract.  Cystoscopy is done using a thin, tube-shaped instrument with a light and camera at the end.  After the procedure, it is common to have some soreness or pain in your abdomen and urethra.  Watch for any blood in your urine. If the amount of blood in your urine increases, call your health care provider.  If you were prescribed an antibiotic medicine, take it as told by your health care provider. Do not stop taking the antibiotic even if you start to feel better. This  information is not intended to replace advice given to you by your health care provider. Make sure you discuss any questions you have with your health care provider. Document Released: 01/01/2000 Document Revised: 12/26/2017 Document Reviewed: 12/26/2017 Elsevier Patient Education  2020 [employer]  

## 2019-01-02 NOTE — Progress Notes (Signed)
01/02/2019 10:01 AM   Glenn Turner 08/20/1965 [id number]  Referring provider: Medicine, [facility] [address] [place],  [place] [address]  Chief Complaint  Patient presents with  . Benign Prostatic Hypertrophy    HPI: 53 year old male who presents today primarily to discuss recent urinary symptoms.  He was previously followed by Dr. Bernardo Heater, last seen in 2016 for hypogonadism. He was on AndroGel at the time.   He continues this medication but now managed by his primary care physician. Most recent labs on 10/2018 within normal limits.  He reports that over the past several years, he has developed worsening urinary urgency and frequency, primarily in the daytime.  He reports he often goes to the bathroom as frequently as once an hour but with severe urgency.  He engages in toilet mapping.  He occasionally has episodes of urge incontinence.  During the nighttime, he will get up to 3 times at night depending on how much he drinks before going to bed.  He also reports today that he is noticed splaying or split urinary stream.  This comes and goes.  Seems to be worsening.  He does have a remote history of gonorrhea in his late teens.  No history of straddle injury or perineal trauma.  He denies any dysuria or gross hematuria.  No history of UTIs.  He takes no medications for his prostate.  He drinks primarily water but occasionally soda per sweet tea.  He does not think his symptoms worsen when he drinks nonwater beverages.  He does have a family history of prostate cancer in his father and brother and thus gets checked frequently.  PSA 0.9 on 10/2018.  Occasional issues with constipation of is not drinking enough water.  He does have slight dry eyes and is using saline eyedrops.  He also mentions today that he has mild erectile dysfunction.  Shim as below.  He uses sildenafil with good effect.  He questions whether or not there is any additional or different  treatment.  He may be interested in penile shockwave.  IPSS    Row Name 01/02/19 0800         International Prostate Symptom Score   How often have you had the sensation of not emptying your bladder?  More than half the time     How often have you had to urinate less than every two hours?  About half the time     How often have you found you stopped and started again several times when you urinated?  Not at All     How often have you found it difficult to postpone urination?  More than half the time     How often have you had a weak urinary stream?  Not at All     How often have you had to strain to start urination?  Not at All     How many times did you typically get up at night to urinate?  2 Times     Total IPSS Score  13       Quality of Life due to urinary symptoms   If you were to spend the rest of your life with your urinary condition just the way it is now how would you feel about that?  Mixed        Score:  1-7 Mild 8-19 Moderate 20-35 Severe  SHIM    Row Name 01/02/19 0855         SHIM: Over  the last 6 months:   How do you rate your confidence that you could get and keep an erection?  Very Low     When you had erections with sexual stimulation, how often were your erections hard enough for penetration (entering your partner)?  Almost Never or Never     During sexual intercourse, how often were you able to maintain your erection after you had penetrated (entered) your partner?  A Few Times (much less than half the time)     During sexual intercourse, how difficult was it to maintain your erection to completion of intercourse?  Very Difficult     When you attempted sexual intercourse, how often was it satisfactory for you?  A Few Times (much less than half the time)       SHIM Total Score   SHIM  8          PMH: Past Medical History:  Diagnosis Date  . Hypertension   . Hypogonadism in male   . Male erectile dysfunction, unspecified 10/12/2011  . Prediabetes  02/01/2018  . Primary osteoarthritis of one knee, left 10/26/2017    Surgical History: History reviewed. No pertinent surgical history.  Home Medications:  Allergies as of 01/02/2019   No Known Allergies     Medication List       Accurate as of January 02, 2019 10:01 AM. If you have any questions, ask your nurse or doctor.        STOP taking these medications   cephALEXin 500 MG capsule Commonly known as: KEFLEX Stopped by: Vanna ScotlandAshley Naseem Varden, MD   HYDROcodone-acetaminophen 5-325 MG tablet Commonly known as: NORCO/VICODIN Stopped by: Vanna ScotlandAshley Ashad Fawbush, MD     TAKE these medications   AndroGel Pump 12.5 MG/ACT (1%) Gel Generic drug: Testosterone Apply topically.   aspirin 81 MG tablet Take 81 mg by mouth daily.   Cialis 5 MG tablet Generic drug: tadalafil Take 5 mg by mouth.   losartan-hydrochlorothiazide 50-12.5 MG tablet Commonly known as: HYZAAR Take 1 tablet by mouth  daily   meloxicam 7.5 MG tablet Commonly known as: MOBIC Take 1 tablet (7.5 mg total) by mouth daily.   oxybutynin 10 MG 24 hr tablet Commonly known as: DITROPAN-XL Take 1 tablet (10 mg total) by mouth daily. Started by: Vanna ScotlandAshley Wilmarie Sparlin, MD   oxyCODONE-acetaminophen 10-325 MG tablet Commonly known as: PERCOCET Take 1 tablet by mouth every 4 (four) hours as needed for pain.   promethazine 25 MG tablet Commonly known as: PHENERGAN Take 25 mg by mouth every 6 (six) hours as needed for nausea or vomiting.   vitamin E 1000 UNIT capsule Take by mouth.       Allergies: No Known Allergies  Family History: History reviewed. No pertinent family history.  Social History:  reports that he has never smoked. He does not have any smokeless tobacco history on file. He reports current alcohol use. No history on file for drug.  ROS: UROLOGY Frequent Urination?: Yes Hard to postpone urination?: Yes Burning/pain with urination?: No Get up at night to urinate?: Yes Leakage of urine?: Yes Urine  stream starts and stops?: No Trouble starting stream?: No Do you have to strain to urinate?: No Blood in urine?: No Urinary tract infection?: No Sexually transmitted disease?: No Injury to kidneys or bladder?: No Painful intercourse?: No Weak stream?: No Erection problems?: Yes Penile pain?: No  Gastrointestinal Nausea?: No Vomiting?: No Indigestion/heartburn?: Yes Diarrhea?: No Constipation?: No  Constitutional Fever: No Night sweats?: No Weight loss?:  No Fatigue?: No  Skin Skin rash/lesions?: No Itching?: No  Eyes Blurred vision?: No Double vision?: No  Ears/Nose/Throat Sore throat?: No Sinus problems?: Yes  Hematologic/Lymphatic Swollen glands?: No Easy bruising?: No  Cardiovascular Leg swelling?: No Chest pain?: No  Respiratory Cough?: No Shortness of breath?: No  Endocrine Excessive thirst?: No  Musculoskeletal Back pain?: Yes Joint pain?: Yes  Neurological Headaches?: No Dizziness?: No  Psychologic Depression?: No Anxiety?: No  Physical Exam: BP (!) 146/82   Pulse 67   Ht 6\' 4"  (1.93 m)   Wt 264 lb (119.7 kg)   BMI 32.14 kg/m   Constitutional:  Alert and oriented, No acute distress. HEENT: Hasty AT, moist mucus membranes.  Trachea midline, no masses. Cardiovascular: No clubbing, cyanosis, or edema. Respiratory: Normal respiratory effort, no increased work of breathing. GI: Abdomen is soft, nontender, nondistended, no abdominal masses Rectal: Normal sphincter tone.  Minimally enlarged prostate, 40 cc, nontender no nodules. Skin: No rashes, bruises or suspicious lesions. Neurologic: Grossly intact, no focal deficits, moving all 4 extremities. Psychiatric: Normal mood and affect.  Laboratory Data: Labs as above  Urinalysis UA today completely negative, see epic for details  Pertinent Imaging: Results for orders placed or performed in visit on 01/02/19  Bladder Scan (Post Void Residual) in office  Result Value Ref Range   Scan  Result 0    Assessment & Plan:    1. Benign prostatic hyperplasia, unspecified whether lower urinary tract symptoms present Mildly enlarged prostate although based on symptomatology, symptoms are more consistent with OAB type symptoms/irritative rather than obstructive  We will hold off on treating his prostate, eval with cystoscopy as below  PSA screening/rectal exam up-to-date, no concern for prostate cancer  - Urinalysis, Complete - Bladder Scan (Post Void Residual) in office  2. Combined arterial insufficiency and corporo-venous occlusive erectile dysfunction Continue sildenafil as needed  We discussed alternatives including Cialis or other PDE 5 inhibitors.  We also discussed penile shockwave today and given that the symptoms are mild, he may respond to this.  I offered him a referral to [facility].  He was concerned about cost today.  He will let [facility] know if he like a referral.  3. Urinary urgency We will treat for urgency frequency type symptoms, trial of oxybutynin 10 mg XL  Possible side effects including dry eyes dry mouth and constipation discussed  We will reassess at next follow-up  Behavioral modification was also discussed.  Patient is prediabetic and unlikely contributing cause.  4. Split urinary stream Recommend cystoscopy to rule out stricture given his history of a split urinary stream, gonorrhea, and urinary symptoms  Discussed the procedure at length, patient is agreeable this plan.   Return in about 6 weeks (around 02/13/2019) for cysto.  02/15/2019, MD  [facility] [address] [place], [place] [phone]

## 2019-02-13 ENCOUNTER — Other Ambulatory Visit: Payer: Self-pay

## 2019-02-13 ENCOUNTER — Encounter: Payer: Self-pay | Admitting: Urology

## 2019-02-13 ENCOUNTER — Ambulatory Visit (INDEPENDENT_AMBULATORY_CARE_PROVIDER_SITE_OTHER): Payer: BLUE CROSS/BLUE SHIELD | Admitting: Urology

## 2019-02-13 VITALS — BP 130/83 | HR 76 | Ht 76.0 in | Wt 252.0 lb

## 2019-02-13 DIAGNOSIS — N401 Enlarged prostate with lower urinary tract symptoms: Secondary | ICD-10-CM

## 2019-02-13 DIAGNOSIS — R350 Frequency of micturition: Secondary | ICD-10-CM

## 2019-02-13 MED ORDER — TAMSULOSIN HCL 0.4 MG PO CAPS: 0.4000 mg | ORAL_CAPSULE | Freq: Every day | ORAL | 3 refills | Status: DC

## 2019-02-13 NOTE — Progress Notes (Signed)
   02/13/19  CC:  Chief Complaint  Patient presents with  . Cysto    HPI: 54 year old male with urinary frequency/urgency as well as spraying of his urinary stream who presents today for cystoscopic evaluation.  Since last visit, he started using oxybutynin 10 mg grams XL.  This is helped some with the symptoms but not completely resolved.  He believes this medication is worth taking.  No side effects.  Blood pressure 130/83, pulse 76, height 6\' 4"  (1.93 m), weight 252 lb (114.3 kg). NED. A&Ox3.   No respiratory distress   Abd soft, NT, ND Normal phallus with bilateral descended testicles  Cystoscopy Procedure Note  Patient identification was confirmed, informed consent was obtained, and patient was prepped using Betadine solution.  Lidocaine jelly was administered per urethral meatus.     Pre-Procedure: - Inspection reveals a normal caliber ureteral meatus.  Procedure: The flexible cystoscope was introduced without difficulty - No urethral strictures/lesions are present. - Enlarged prostate bilobar coaptation, 5 cm prostatic fossa - Normal bladder neck - Bilateral ureteral orifices identified - Bladder mucosa  reveals no ulcers, tumors, or lesions - No bladder stones - No trabeculation  Retroflexion unremarkable   Post-Procedure: - Patient tolerated the procedure well  Assessment/ Plan:  1. Benign prostatic hyperplasia with urinary frequency Cystoscopy today unremarkable other than for some moderate prostamegaly without cystoscopic deleterious bladder changes  No evidence of urethral stricture to explain spraying of urinary stream  Some mild benefit with oxybutynin.    Given the presence of prostamegaly today on cystoscopy, patient may also benefit from additional Flomax.  Plan to trial this medication for 6 weeks and reassess.  He is agreeable this plan. - Urinalysis, Complete   6 weeks virtual visit to reassess symptoms  , MD

## 2019-02-14 LAB — URINALYSIS, COMPLETE
Bilirubin, UA: NEGATIVE
Glucose, UA: NEGATIVE
Ketones, UA: NEGATIVE
Leukocytes,UA: NEGATIVE
Nitrite, UA: NEGATIVE
Protein,UA: NEGATIVE
RBC, UA: NEGATIVE
Specific Gravity, UA: 1.025 (ref 1.005–1.030)
Urobilinogen, Ur: 0.2 mg/dL (ref 0.2–1.0)
pH, UA: 5 (ref 5.0–7.5)

## 2019-02-14 LAB — MICROSCOPIC EXAMINATION
Bacteria, UA: NONE SEEN
Epithelial Cells (non renal): NONE SEEN /hpf (ref 0–10)
RBC: NONE SEEN /hpf (ref 0–2)
WBC, UA: NONE SEEN /hpf (ref 0–5)

## 2019-07-26 ENCOUNTER — Other Ambulatory Visit: Payer: Self-pay | Admitting: Urology

## 2019-08-13 NOTE — Progress Notes (Signed)
Patient was unaware of visit today and we were unable to connect.  Plan to reschedule for in person visit.  Vanna Scotland, MD

## 2019-08-14 ENCOUNTER — Telehealth (INDEPENDENT_AMBULATORY_CARE_PROVIDER_SITE_OTHER): Payer: BC Managed Care – PPO | Admitting: Urology

## 2019-08-14 DIAGNOSIS — N401 Enlarged prostate with lower urinary tract symptoms: Secondary | ICD-10-CM

## 2019-08-14 DIAGNOSIS — R350 Frequency of micturition: Secondary | ICD-10-CM

## 2019-08-14 NOTE — Progress Notes (Signed)
This service is provided via telemedicine   No vital signs collected/recorded due to the encounter was a telemedicine visit.     Patient consents to a telephone visit:  Yes    Medications, history and pharmacy updated.  Names of all persons participating in the telemedicine service and their role in the encounter:  Challis Crill,CMA

## 2019-08-16 ENCOUNTER — Other Ambulatory Visit: Payer: Self-pay | Admitting: *Deleted

## 2019-08-16 MED ORDER — TAMSULOSIN HCL 0.4 MG PO CAPS: 0.4000 mg | ORAL_CAPSULE | Freq: Every day | ORAL | 1 refills | Status: AC

## 2019-08-16 MED ORDER — OXYBUTYNIN CHLORIDE ER 10 MG PO TB24: 10.0000 mg | ORAL_TABLET | Freq: Every day | ORAL | 1 refills | Status: AC

## 2020-02-22 DIAGNOSIS — U071 COVID-19: Secondary | ICD-10-CM | POA: Diagnosis not present

## 2020-02-22 DIAGNOSIS — Z20822 Contact with and (suspected) exposure to covid-19: Secondary | ICD-10-CM | POA: Diagnosis not present

## 2020-03-11 DIAGNOSIS — M79671 Pain in right foot: Secondary | ICD-10-CM | POA: Diagnosis not present

## 2020-03-11 DIAGNOSIS — M79672 Pain in left foot: Secondary | ICD-10-CM | POA: Diagnosis not present

## 2020-03-11 DIAGNOSIS — M6289 Other specified disorders of muscle: Secondary | ICD-10-CM | POA: Diagnosis not present

## 2020-10-30 DIAGNOSIS — R899 Unspecified abnormal finding in specimens from other organs, systems and tissues: Secondary | ICD-10-CM | POA: Diagnosis not present

## 2020-10-30 DIAGNOSIS — R718 Other abnormality of red blood cells: Secondary | ICD-10-CM | POA: Diagnosis not present

## 2020-10-30 DIAGNOSIS — D582 Other hemoglobinopathies: Secondary | ICD-10-CM | POA: Diagnosis not present

## 2020-10-30 DIAGNOSIS — R7989 Other specified abnormal findings of blood chemistry: Secondary | ICD-10-CM | POA: Diagnosis not present

## 2020-12-07 DIAGNOSIS — M79672 Pain in left foot: Secondary | ICD-10-CM | POA: Diagnosis not present

## 2020-12-18 DIAGNOSIS — M79672 Pain in left foot: Secondary | ICD-10-CM | POA: Diagnosis not present

## 2020-12-24 DIAGNOSIS — M79672 Pain in left foot: Secondary | ICD-10-CM | POA: Diagnosis not present

## 2020-12-30 DIAGNOSIS — M79672 Pain in left foot: Secondary | ICD-10-CM | POA: Diagnosis not present

## 2021-01-06 DIAGNOSIS — M6289 Other specified disorders of muscle: Secondary | ICD-10-CM | POA: Diagnosis not present

## 2021-01-06 DIAGNOSIS — M79672 Pain in left foot: Secondary | ICD-10-CM | POA: Diagnosis not present

## 2021-01-27 DIAGNOSIS — M6289 Other specified disorders of muscle: Secondary | ICD-10-CM | POA: Diagnosis not present

## 2021-01-27 DIAGNOSIS — M79672 Pain in left foot: Secondary | ICD-10-CM | POA: Diagnosis not present

## 2021-01-28 DIAGNOSIS — M9262 Juvenile osteochondrosis of tarsus, left ankle: Secondary | ICD-10-CM | POA: Diagnosis not present

## 2021-03-29 DIAGNOSIS — G8918 Other acute postprocedural pain: Secondary | ICD-10-CM | POA: Diagnosis not present

## 2021-03-29 DIAGNOSIS — M898X7 Other specified disorders of bone, ankle and foot: Secondary | ICD-10-CM | POA: Diagnosis not present

## 2021-03-29 DIAGNOSIS — M7732 Calcaneal spur, left foot: Secondary | ICD-10-CM | POA: Diagnosis not present

## 2021-03-29 DIAGNOSIS — M7662 Achilles tendinitis, left leg: Secondary | ICD-10-CM | POA: Diagnosis not present

## 2021-03-29 DIAGNOSIS — M9262 Juvenile osteochondrosis of tarsus, left ankle: Secondary | ICD-10-CM | POA: Diagnosis not present

## 2021-03-29 DIAGNOSIS — M67972 Unspecified disorder of synovium and tendon, left ankle and foot: Secondary | ICD-10-CM | POA: Diagnosis not present

## 2021-03-29 DIAGNOSIS — M216X2 Other acquired deformities of left foot: Secondary | ICD-10-CM | POA: Diagnosis not present

## 2021-04-19 DIAGNOSIS — M79672 Pain in left foot: Secondary | ICD-10-CM | POA: Diagnosis not present

## 2021-05-04 DIAGNOSIS — Z0000 Encounter for general adult medical examination without abnormal findings: Secondary | ICD-10-CM | POA: Diagnosis not present

## 2021-05-04 DIAGNOSIS — E669 Obesity, unspecified: Secondary | ICD-10-CM | POA: Diagnosis not present

## 2021-05-04 DIAGNOSIS — F411 Generalized anxiety disorder: Secondary | ICD-10-CM | POA: Diagnosis not present

## 2021-05-04 DIAGNOSIS — E7800 Pure hypercholesterolemia, unspecified: Secondary | ICD-10-CM | POA: Diagnosis not present

## 2021-06-01 DIAGNOSIS — Z23 Encounter for immunization: Secondary | ICD-10-CM | POA: Diagnosis not present

## 2021-07-29 DIAGNOSIS — T8189XD Other complications of procedures, not elsewhere classified, subsequent encounter: Secondary | ICD-10-CM | POA: Diagnosis not present

## 2021-07-29 DIAGNOSIS — L97422 Non-pressure chronic ulcer of left heel and midfoot with fat layer exposed: Secondary | ICD-10-CM | POA: Diagnosis not present

## 2021-07-29 DIAGNOSIS — Y838 Other surgical procedures as the cause of abnormal reaction of the patient, or of later complication, without mention of misadventure at the time of the procedure: Secondary | ICD-10-CM | POA: Diagnosis not present

## 2021-07-30 DIAGNOSIS — S91302A Unspecified open wound, left foot, initial encounter: Secondary | ICD-10-CM | POA: Diagnosis not present

## 2021-08-01 DIAGNOSIS — M25572 Pain in left ankle and joints of left foot: Secondary | ICD-10-CM | POA: Diagnosis not present

## 2021-08-03 ENCOUNTER — Other Ambulatory Visit: Payer: Self-pay | Admitting: Internal Medicine

## 2021-08-03 DIAGNOSIS — E785 Hyperlipidemia, unspecified: Secondary | ICD-10-CM

## 2021-08-04 DIAGNOSIS — Z4889 Encounter for other specified surgical aftercare: Secondary | ICD-10-CM | POA: Diagnosis not present

## 2021-08-04 DIAGNOSIS — S91302D Unspecified open wound, left foot, subsequent encounter: Secondary | ICD-10-CM | POA: Diagnosis not present

## 2021-08-12 DIAGNOSIS — S91302D Unspecified open wound, left foot, subsequent encounter: Secondary | ICD-10-CM | POA: Diagnosis not present

## 2021-08-26 DIAGNOSIS — Z09 Encounter for follow-up examination after completed treatment for conditions other than malignant neoplasm: Secondary | ICD-10-CM | POA: Diagnosis not present

## 2021-08-26 DIAGNOSIS — L905 Scar conditions and fibrosis of skin: Secondary | ICD-10-CM | POA: Diagnosis not present

## 2021-08-30 DIAGNOSIS — R300 Dysuria: Secondary | ICD-10-CM | POA: Diagnosis not present

## 2021-09-01 DIAGNOSIS — M25572 Pain in left ankle and joints of left foot: Secondary | ICD-10-CM | POA: Diagnosis not present

## 2021-09-02 DIAGNOSIS — Z23 Encounter for immunization: Secondary | ICD-10-CM | POA: Diagnosis not present

## 2021-09-30 DIAGNOSIS — E291 Testicular hypofunction: Secondary | ICD-10-CM | POA: Diagnosis not present

## 2021-09-30 DIAGNOSIS — N529 Male erectile dysfunction, unspecified: Secondary | ICD-10-CM | POA: Diagnosis not present

## 2021-09-30 DIAGNOSIS — R7303 Prediabetes: Secondary | ICD-10-CM | POA: Diagnosis not present

## 2021-09-30 DIAGNOSIS — N481 Balanitis: Secondary | ICD-10-CM | POA: Diagnosis not present

## 2021-10-02 DIAGNOSIS — M25572 Pain in left ankle and joints of left foot: Secondary | ICD-10-CM | POA: Diagnosis not present

## 2021-10-22 ENCOUNTER — Other Ambulatory Visit: Payer: BLUE CROSS/BLUE SHIELD

## 2021-11-05 DIAGNOSIS — I10 Essential (primary) hypertension: Secondary | ICD-10-CM | POA: Diagnosis not present

## 2021-11-05 DIAGNOSIS — R0789 Other chest pain: Secondary | ICD-10-CM | POA: Diagnosis not present

## 2022-01-25 DIAGNOSIS — I340 Nonrheumatic mitral (valve) insufficiency: Secondary | ICD-10-CM | POA: Diagnosis not present

## 2022-01-31 DIAGNOSIS — K635 Polyp of colon: Secondary | ICD-10-CM | POA: Diagnosis not present

## 2022-01-31 DIAGNOSIS — Z1211 Encounter for screening for malignant neoplasm of colon: Secondary | ICD-10-CM | POA: Diagnosis not present

## 2022-01-31 DIAGNOSIS — D120 Benign neoplasm of cecum: Secondary | ICD-10-CM | POA: Diagnosis not present

## 2022-02-14 DIAGNOSIS — Z0000 Encounter for general adult medical examination without abnormal findings: Secondary | ICD-10-CM | POA: Diagnosis not present

## 2022-02-21 DIAGNOSIS — J309 Allergic rhinitis, unspecified: Secondary | ICD-10-CM | POA: Diagnosis not present

## 2022-02-21 DIAGNOSIS — Z0000 Encounter for general adult medical examination without abnormal findings: Secondary | ICD-10-CM | POA: Diagnosis not present

## 2022-02-21 DIAGNOSIS — N481 Balanitis: Secondary | ICD-10-CM | POA: Diagnosis not present

## 2022-03-07 DIAGNOSIS — N481 Balanitis: Secondary | ICD-10-CM | POA: Diagnosis not present

## 2022-04-13 DIAGNOSIS — L80 Vitiligo: Secondary | ICD-10-CM | POA: Diagnosis not present

## 2022-05-05 DIAGNOSIS — I10 Essential (primary) hypertension: Secondary | ICD-10-CM | POA: Diagnosis not present

## 2022-05-05 DIAGNOSIS — N529 Male erectile dysfunction, unspecified: Secondary | ICD-10-CM | POA: Diagnosis not present

## 2022-08-08 DIAGNOSIS — G629 Polyneuropathy, unspecified: Secondary | ICD-10-CM | POA: Diagnosis not present

## 2022-08-08 DIAGNOSIS — Z0000 Encounter for general adult medical examination without abnormal findings: Secondary | ICD-10-CM | POA: Diagnosis not present
